# Patient Record
Sex: Male | Born: 1995 | Hispanic: No | Marital: Single | Smoking: Never smoker
Health system: Southern US, Community
[De-identification: ages and names within clinical notes are randomized; demographics above are authoritative.]

## PROBLEM LIST (undated history)

## (undated) DIAGNOSIS — J45909 Unspecified asthma, uncomplicated: Secondary | ICD-10-CM

## (undated) HISTORY — DX: Unspecified asthma, uncomplicated: J45.909

---
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
## 2015-07-14 ENCOUNTER — Ambulatory Visit: Payer: Self-pay | Admitting: Internal Medicine

## 2015-09-11 DIAGNOSIS — Z8709 Personal history of other diseases of the respiratory system: Secondary | ICD-10-CM | POA: Insufficient documentation

## 2015-09-11 DIAGNOSIS — L299 Pruritus, unspecified: Secondary | ICD-10-CM | POA: Insufficient documentation

## 2015-09-11 DIAGNOSIS — Z91013 Allergy to seafood: Secondary | ICD-10-CM | POA: Insufficient documentation

## 2015-09-11 DIAGNOSIS — Z91012 Allergy to eggs: Secondary | ICD-10-CM | POA: Insufficient documentation

## 2015-09-11 DIAGNOSIS — Z91018 Allergy to other foods: Secondary | ICD-10-CM | POA: Insufficient documentation

## 2015-09-11 DIAGNOSIS — T781XXA Other adverse food reactions, not elsewhere classified, initial encounter: Secondary | ICD-10-CM | POA: Insufficient documentation

## 2016-03-17 ENCOUNTER — Ambulatory Visit (INDEPENDENT_AMBULATORY_CARE_PROVIDER_SITE_OTHER): Payer: Managed Care, Other (non HMO) | Admitting: Physician Assistant

## 2016-03-17 ENCOUNTER — Encounter: Payer: Self-pay | Admitting: Physician Assistant

## 2016-03-17 VITALS — BP 127/92 | HR 111 | Temp 97.4°F | Wt 116.0 lb

## 2016-03-17 DIAGNOSIS — J029 Acute pharyngitis, unspecified: Secondary | ICD-10-CM

## 2016-03-17 MED ORDER — GUAIFENESIN-CODEINE 100-10 MG/5ML PO SYRP: 5.0000 mL | ORAL_SOLUTION | Freq: Three times a day (TID) | ORAL | 0 refills | Status: DC | PRN

## 2016-03-17 NOTE — Patient Instructions (Addendum)
Pharyngitis Pharyngitis is a sore throat (pharynx). There is redness, pain, and swelling of your throat. Follow these instructions at home:  Drink enough fluids to keep your pee (urine) clear or pale yellow.  Only take medicine as told by your doctor.  You may get sick again if you do not take medicine as told. Finish your medicines, even if you start to feel better.  Do not take aspirin.  Rest.  Rinse your mouth (gargle) with salt water ( tsp of salt per 1 qt of water) every 1-2 hours. This will help the pain.  If you are not at risk for choking, you can suck on hard candy or sore throat lozenges. Contact a doctor if:  You have large, tender lumps on your neck.  You have a rash.  You cough up green, yellow-brown, or bloody spit. Get help right away if:  You have a stiff neck.  You drool or cannot swallow liquids.  You throw up (vomit) or are not able to keep medicine or liquids down.  You have very bad pain that does not go away with medicine.  You have problems breathing (not from a stuffy nose). This information is not intended to replace advice given to you by your health care provider. Make sure you discuss any questions you have with your health care provider. Document Released: 07/28/2007 Document Revised: 07/17/2015 Document Reviewed: 10/16/2012 Elsevier Interactive Patient Education  2017 Elsevier Inc.   Consider Ibuprofen 800mg  alternate with tylenol 1000mg  every 4-6 hours.

## 2016-03-17 NOTE — Progress Notes (Signed)
   Subjective:    Patient ID: Alan Maxwell, male    DOB: Nov 21, 1995, 21 y.o.   MRN: [id number]  HPI  Pt is a 21 yo male who presents to the clinic with 2 days of ST, headache, fatigue. He has a childhood hx of asthma but has not given him any problems recently. He has albuterol inhaler at home but not using it. He does have some chest tightness but no wheezing. nyquil helped yesterday. No fever, chills, ear pain, or sinus pressure. No sick contacts. Cough is mild and dry. Rates ST 2/10.   Review of Systems  All other systems reviewed and are negative.      Objective:   Physical Exam  Constitutional: He is oriented to person, place, and time. He appears well-developed and well-nourished.  HENT:  Head: Normocephalic and atraumatic.  Right Ear: External ear normal.  Left Ear: External ear normal.  Nose: Nose normal.  Mouth/Throat: No oropharyngeal exudate.  TM's clear bilaterally.  Oropharynx erythematous without tonsilar swelling or exudate.  Negative for any maxillary or frontal sinus tenderness.   Eyes: Conjunctivae are normal. Right eye exhibits no discharge. Left eye exhibits no discharge.  Neck: Normal range of motion. Neck supple. No thyromegaly present.  Cardiovascular: Normal rate, regular rhythm and normal heart sounds.   Pulmonary/Chest: Effort normal and breath sounds normal.  Lymphadenopathy:    He has no cervical adenopathy.  Neurological: He is alert and oriented to person, place, and time.  Psychiatric: He has a normal mood and affect. His behavior is normal.          Assessment & Plan:  Marland Kitchen.Marland Kitchen.Alan Maxwell was seen today for sore throat.  Diagnoses and all orders for this visit:  Acute pharyngitis, unspecified etiology  Other orders -     guaiFENesin-codeine (ROBITUSSIN AC) 100-10 MG/5ML syrup; Take 5 mLs by mouth 3 (three) times daily as needed for cough.   Centor criteria -1 low probability for strep.  Discussed symptomatic care.  Encouraged ibuprofen 800mg   and/or tylenol 1000mg  every 4-6 hours.  Use albuterol 2 puffs as needed for SOB/Wheezing.  HO given.  Follow up as needed.

## 2016-10-27 ENCOUNTER — Ambulatory Visit (INDEPENDENT_AMBULATORY_CARE_PROVIDER_SITE_OTHER): Payer: Managed Care, Other (non HMO) | Admitting: Family Medicine

## 2016-10-27 ENCOUNTER — Encounter: Payer: Self-pay | Admitting: Family Medicine

## 2016-10-27 VITALS — BP 112/78 | HR 81 | Wt 116.0 lb

## 2016-10-27 DIAGNOSIS — G44309 Post-traumatic headache, unspecified, not intractable: Secondary | ICD-10-CM

## 2016-10-27 NOTE — Patient Instructions (Signed)
Thank you for coming in today.  Recheck as needed.  I think you should get better.
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
## 2017-10-11 ENCOUNTER — Encounter: Payer: Self-pay | Admitting: Family Medicine

## 2017-10-11 ENCOUNTER — Ambulatory Visit (INDEPENDENT_AMBULATORY_CARE_PROVIDER_SITE_OTHER): Payer: 59 | Admitting: Family Medicine

## 2017-10-11 VITALS — BP 97/52 | HR 72 | Ht 66.0 in | Wt 120.0 lb

## 2017-10-11 DIAGNOSIS — R5383 Other fatigue: Secondary | ICD-10-CM | POA: Diagnosis not present

## 2017-10-11 DIAGNOSIS — R6889 Other general symptoms and signs: Secondary | ICD-10-CM | POA: Diagnosis not present

## 2017-10-11 NOTE — Progress Notes (Signed)
Subjective:    Patient ID: Alan Maxwell, male    DOB: Sep 16, 1995, 22 y.o.   MRN: [id number]  HPI 22 year old male comes in today complaining of not feeling well since Sunday.  He has been working out in the heat and says he just did not feel well the next day when he got up.  He just felt extremely tired and extremely drained.  He says he just does not feel good when he is been working out in the heat.  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  He says he really does try to hydrate well.  He does have a history of asthma but he denies any recent chest pain or shortness of breath.  He does take Zyrtec seasonally but is not currently taking it.  Missed work on Sunday and is requesting a note for work.  And a note requesting that he not work out in the heat.   Review of Systems Fever, chills or sweats.  No pain anywhere.  BP (!) 97/52   Pulse 72   Ht 5\' 6"  (1.676 m)   Wt 120 lb (54.4 kg)   SpO2 98%   PF 380 L/min   BMI 19.37 kg/m     No Known Allergies  History reviewed. No pertinent past medical history.  History reviewed. No pertinent surgical history.  Social History   Socioeconomic History  . Marital status: Single    Spouse name: Not on file  . Number of children: Not on file  . Years of education: Not on file  . Highest education level: Not on file  Occupational History  . Not on file  Social Needs  . Financial resource strain: Not on file  . Food insecurity:    Worry: Not on file    Inability: Not on file  . Transportation needs:    Medical: Not on file    Non-medical: Not on file  Tobacco Use  . Smoking status: Never Smoker  . Smokeless tobacco: Never Used  Substance and Sexual Activity  . Alcohol use: Not on file  . Drug use: Not on file  . Sexual activity: Not on file  Lifestyle  . Physical activity:    Days per week: Not on file    Minutes per session: Not on file  . Stress: Not on file   Relationships  . Social connections:    Talks on phone: Not on file    Gets together: Not on file    Attends religious service: Not on file    Active member of club or organization: Not on file    Attends meetings of clubs or organizations: Not on file    Relationship status: Not on file  . Intimate partner violence:    Fear of current or ex partner: Not on file    Emotionally abused: Not on file    Physically abused: Not on file    Forced sexual activity: Not on file  Other Topics Concern  . Not on file  Social History Narrative  . Not on file    History reviewed. No pertinent family history.  Outpatient Encounter Medications as of 10/11/2017  Medication Sig  . Cetirizine HCl (ZYRTEC ALLERGY PO) Take by mouth.   No facility-administered encounter medications on file as of 10/11/2017.         Objective:   Physical Exam  Constitutional: He is oriented to person, place,  and time. He appears well-developed and well-nourished.  HENT:  Head: Normocephalic and atraumatic.  Right Ear: External ear normal.  Left Ear: External ear normal.  Nose: Nose normal.  Mouth/Throat: Oropharynx is clear and moist.  TMs and canals are clear.   Eyes: Pupils are equal, round, and reactive to light. Conjunctivae and EOM are normal.  Neck: Neck supple. No thyromegaly present.  Cardiovascular: Normal rate and normal heart sounds.  Pulmonary/Chest: Effort normal and breath sounds normal.  Lymphadenopathy:    He has no cervical adenopathy.  Neurological: He is alert and oriented to person, place, and time.  Skin: Skin is warm and dry.  Psychiatric: He has a normal mood and affect.        Assessment & Plan:  Fatigue-it happened after being out in the heat on a really hot humid day.  I explained that this is a little unusual for someone who is otherwise healthy and young so I would like to do some additional work-up including checking for anemia, checking thyroid, checking for normal kidney  and liver function.  Heat Sensitivity-we will provide note to work asking if they can allow him to work indoors on days that are greater than 85 degrees. Check TSH

## 2017-10-12 LAB — COMPLETE METABOLIC PANEL WITH GFR
AG RATIO: 1.9 (calc) (ref 1.0–2.5)
ALBUMIN MSPROF: 5.1 g/dL (ref 3.6–5.1)
ALT: 14 U/L (ref 9–46)
AST: 15 U/L (ref 10–40)
Alkaline phosphatase (APISO): 52 U/L (ref 40–115)
BUN: 22 mg/dL (ref 7–25)
CO2: 29 mmol/L (ref 20–32)
CREATININE: 1.27 mg/dL (ref 0.60–1.35)
Calcium: 10.5 mg/dL — ABNORMAL HIGH (ref 8.6–10.3)
Chloride: 102 mmol/L (ref 98–110)
GFR, EST AFRICAN AMERICAN: 92 mL/min/{1.73_m2} (ref >=60)
GFR, EST NON AFRICAN AMERICAN: 79 mL/min/{1.73_m2} (ref >=60)
GLOBULIN: 2.7 g/dL (ref 1.9–3.7)
Glucose, Bld: 83 mg/dL (ref 65–99)
POTASSIUM: 4.5 mmol/L (ref 3.5–5.3)
SODIUM: 138 mmol/L (ref 135–146)
TOTAL PROTEIN: 7.8 g/dL (ref 6.1–8.1)
Total Bilirubin: 2.4 mg/dL — ABNORMAL HIGH (ref 0.2–1.2)

## 2017-10-12 LAB — IRON,TIBC AND FERRITIN PANEL
%SAT: 47 % (ref 20–48)
Ferritin: 157 ng/mL (ref 38–380)
Iron: 133 ug/dL (ref 50–195)
TIBC: 281 mcg/dL (calc) (ref 250–425)

## 2017-10-12 LAB — CBC
HEMATOCRIT: 49.7 % (ref 38.5–50.0)
Hemoglobin: 16.6 g/dL (ref 13.2–17.1)
MCH: 31.3 pg (ref 27.0–33.0)
MCHC: 33.4 g/dL (ref 32.0–36.0)
MCV: 93.6 fL (ref 80.0–100.0)
MPV: 10.7 fL (ref 7.5–12.5)
PLATELETS: 271 10*3/uL (ref 140–400)
RBC: 5.31 10*6/uL (ref 4.20–5.80)
RDW: 11.1 % (ref 11.0–15.0)
WBC: 6.1 10*3/uL (ref 3.8–10.8)

## 2017-10-14 ENCOUNTER — Other Ambulatory Visit: Payer: Self-pay | Admitting: *Deleted

## 2017-10-14 DIAGNOSIS — R899 Unspecified abnormal finding in specimens from other organs, systems and tissues: Secondary | ICD-10-CM

## 2017-10-14 DIAGNOSIS — R5383 Other fatigue: Secondary | ICD-10-CM

## 2017-12-16 ENCOUNTER — Ambulatory Visit: Payer: 59 | Admitting: Physician Assistant

## 2017-12-16 ENCOUNTER — Encounter: Payer: Self-pay | Admitting: Physician Assistant

## 2017-12-16 VITALS — BP 114/76 | HR 72 | Temp 97.9°F | Wt 117.0 lb

## 2017-12-16 DIAGNOSIS — J029 Acute pharyngitis, unspecified: Secondary | ICD-10-CM | POA: Diagnosis not present

## 2017-12-16 DIAGNOSIS — J020 Streptococcal pharyngitis: Secondary | ICD-10-CM | POA: Diagnosis not present

## 2017-12-16 LAB — POCT RAPID STREP A (OFFICE): RAPID STREP A SCREEN: POSITIVE — AB

## 2017-12-16 MED ORDER — PENICILLIN G BENZATHINE 1200000 UNIT/2ML IM SUSP
1.2000 10*6.[IU] | Freq: Once | INTRAMUSCULAR | Status: AC
  Administered 2017-12-16: 1.2 10*6.[IU] via INTRAMUSCULAR

## 2017-12-16 MED ORDER — PREDNISONE 20 MG PO TABS: 40.0000 mg | ORAL_TABLET | Freq: Every day | ORAL | 0 refills | Status: AC

## 2017-12-16 NOTE — Progress Notes (Signed)
HPI:                                                                Alan Maxwell is a 22 y.o. male who presents to [facility] today for sore throat  Sore Throat   This is a new problem. The current episode started in the past 7 days. The problem has been gradually improving. The pain is worse on the left side. There has been no fever. The pain is moderate. Associated symptoms include swollen glands and trouble swallowing. Pertinent negatives include no coughing, drooling, ear pain or headaches. He has had no exposure to strep. He has tried nothing for the symptoms.      Past Medical History:  Diagnosis Date  . Asthma    History reviewed. No pertinent surgical history. Social History   Tobacco Use  . Smoking status: Never Smoker  . Smokeless tobacco: Never Used  Substance Use Topics  . Alcohol use: Not on file   family history is not on file.    ROS: negative except as noted in the HPI  Medications: Current Outpatient Medications  Medication Sig Dispense Refill  . Cetirizine HCl (ZYRTEC ALLERGY PO) Take by mouth.     No current facility-administered medications for this visit.    No Known Allergies     Objective:  BP 114/76   Pulse 72   Temp 97.9 F (36.6 C) (Oral)   Wt 117 lb (53.1 kg)   BMI 18.88 kg/m  Gen:  alert, not ill-appearing, no distress, appropriate for age HEENT: head normocephalic without obvious abnormality, conjunctiva and cornea clear, TM's clear, nasal mucosa pink, orpharynx with erythema, tonsils grade 2, no exudates, uvula midline, neck supple, there is left tonsillar adenopathy, trachea midline Pulm: Normal work of breathing, normal phonation, clear to auscultation bilaterally, no wheezes, rales or rhonchi CV: Normal rate, regular rhythm, s1 and s2 distinct, no murmurs, clicks or rubs  Neuro: alert and oriented x 3, no tremor MSK: extremities atraumatic, normal gait and station Skin:  intact, no rashes on exposed skin, no jaundice, no cyanosis   No results found for this or any previous visit (from the past 72 hour(s)). No results found.    Assessment and Plan: 22 y.o. male with   .Alan Maxwell was seen today for sore throat.  Diagnoses and all orders for this visit:  Acute streptococcal pharyngitis -     predniSONE (DELTASONE) 20 MG tablet; Take 2 tablets (40 mg total) by mouth daily with breakfast for 5 days. -     penicillin g benzathine (BICILLIN LA) 1200000 UNIT/2ML injection 1.2 Million Units  Sore throat -     POCT rapid strep A   Treated in office with Bicillin Prednisone burst x 5 days  Patient education and anticipatory guidance given Patient agrees with treatment plan Follow-up as needed if symptoms worsen or fail to improve  Levonne Hubert PA-C

## 2017-12-16 NOTE — Patient Instructions (Signed)
Strep Throat Strep throat is a bacterial infection of the throat. Your health care provider may call the infection tonsillitis or pharyngitis, depending on whether there is swelling in the tonsils or at the back of the throat. Strep throat is most common during the cold months of the year in children who are 5-23 years of age, but it can happen during any season in people of any age. This infection is spread from person to person (contagious) through coughing, sneezing, or close contact. What are the causes? Strep throat is caused by the bacteria called Streptococcus pyogenes. What increases the risk? This condition is more likely to develop in:  People who spend time in crowded places where the infection can spread easily.  People who have close contact with someone who has strep throat.  What are the signs or symptoms? Symptoms of this condition include:  Fever or chills.  Redness, swelling, or pain in the tonsils or throat.  Pain or difficulty when swallowing.  White or yellow spots on the tonsils or throat.  Swollen, tender glands in the neck or under the jaw.  Red rash all over the body (rare).  How is this diagnosed? This condition is diagnosed by performing a rapid strep test or by taking a swab of your throat (throat culture test). Results from a rapid strep test are usually ready in a few minutes, but throat culture test results are available after one or two days. How is this treated? This condition is treated with antibiotic medicine. Follow these instructions at home: Medicines  Take over-the-counter and prescription medicines only as told by your health care provider.  Take your antibiotic as told by your health care provider. Do not stop taking the antibiotic even if you start to feel better.  Have family members who also have a sore throat or fever tested for strep throat. They may need antibiotics if they have the strep infection. Eating and drinking  Do not  share food, drinking cups, or personal items that could cause the infection to spread to other people.  If swallowing is difficult, try eating soft foods until your sore throat feels better.  Drink enough fluid to keep your urine clear or pale yellow. General instructions  Gargle with a salt-water mixture 3-4 times per day or as needed. To make a salt-water mixture, completely dissolve -1 tsp of salt in 1 cup of warm water.  Make sure that all household members wash their hands well.  Get plenty of rest.  Stay home from school or work until you have been taking antibiotics for 24 hours.  Keep all follow-up visits as told by your health care provider. This is important. Contact a health care provider if:  The glands in your neck continue to get bigger.  You develop a rash, cough, or earache.  You cough up a thick liquid that is green, yellow-brown, or bloody.  You have pain or discomfort that does not get better with medicine.  Your problems seem to be getting worse rather than better.  You have a fever. Get help right away if:  You have new symptoms, such as vomiting, severe headache, stiff or painful neck, chest pain, or shortness of breath.  You have severe throat pain, drooling, or changes in your voice.  You have swelling of the neck, or the skin on the neck becomes red and tender.  You have signs of dehydration, such as fatigue, dry mouth, and decreased urination.  You become increasingly sleepy, or   you cannot wake up completely.  Your joints become red or painful. This information is not intended to replace advice given to you by your health care provider. Make sure you discuss any questions you have with your health care provider. Document Released: 02/06/2000 Document Revised: 10/08/2015 Document Reviewed: 06/03/2014 Elsevier Interactive Patient Education  2018 [employer]vier Inc.  

## 2020-04-15 ENCOUNTER — Ambulatory Visit: Payer: 59 | Admitting: Family Medicine

## 2020-04-21 ENCOUNTER — Other Ambulatory Visit: Payer: Self-pay

## 2020-04-21 ENCOUNTER — Encounter: Payer: Self-pay | Admitting: Family Medicine

## 2020-04-21 ENCOUNTER — Ambulatory Visit (INDEPENDENT_AMBULATORY_CARE_PROVIDER_SITE_OTHER): Payer: 59 | Admitting: Family Medicine

## 2020-04-21 VITALS — BP 134/48 | HR 62 | Ht 66.0 in | Wt 120.0 lb

## 2020-04-21 DIAGNOSIS — R109 Unspecified abdominal pain: Secondary | ICD-10-CM

## 2020-04-21 DIAGNOSIS — L309 Dermatitis, unspecified: Secondary | ICD-10-CM | POA: Insufficient documentation

## 2020-04-21 DIAGNOSIS — L2082 Flexural eczema: Secondary | ICD-10-CM

## 2020-04-21 NOTE — Progress Notes (Signed)
Acute Office Visit  Subjective:    Patient ID: Alan Maxwell, male    DOB: 1995-05-09, 25 y.o.   MRN: [id number]  Chief Complaint  Patient presents with  . Abdominal Pain    HPI Patient is in today for left left lateral chest pain over the rib area below the axilla.  He states been going on for about a few weeks.  Rates his pain a 1 out of 10 today.  At its worst he would rated a 3 out of 10.  He says it can last an hour or 2 but that is usually at the longest.  Nothing really alleviates the pain once it starts he just has to wait till it goes away it can sometimes be worse if he lays on his left side.  He denies any fever, sweats, chills.  Denies any nausea vomiting or diarrhea.  No blood in the stool.  No dysuria.  Scribes the pain is dull and sometimes throbbing.  Past Medical History:  Diagnosis Date  . Asthma     History reviewed. No pertinent surgical history.  History reviewed. No pertinent family history.  Social History   Socioeconomic History  . Marital status: Single    Spouse name: Not on file  . Number of children: Not on file  . Years of education: Not on file  . Highest education level: Not on file  Occupational History  . Not on file  Tobacco Use  . Smoking status: Never Smoker  . Smokeless tobacco: Never Used  Substance and Sexual Activity  . Alcohol use: Not on file  . Drug use: Not on file  . Sexual activity: Not on file  Other Topics Concern  . Not on file  Social History Narrative  . Not on file   Social Determinants of Health   Financial Resource Strain: Not on file  Food Insecurity: Not on file  Transportation Needs: Not on file  Physical Activity: Not on file  Stress: Not on file  Social Connections: Not on file  Intimate Partner Violence: Not on file    Outpatient Medications Prior to Visit  Medication Sig Dispense Refill  . Cetirizine HCl (ZYRTEC ALLERGY PO) Take by mouth.     No facility-administered medications prior to  visit.    No Known Allergies  Review of Systems     Objective:    Physical Exam Constitutional:      Appearance: He is well-developed and well-nourished.  HENT:     Head: Normocephalic and atraumatic.  Cardiovascular:     Rate and Rhythm: Normal rate and regular rhythm.     Heart sounds: Normal heart sounds.  Pulmonary:     Effort: Pulmonary effort is normal.     Breath sounds: Normal breath sounds.  Abdominal:     General: Abdomen is flat. Bowel sounds are normal.     Palpations: Abdomen is soft.     Tenderness: There is generalized abdominal tenderness. There is no guarding or rebound.     Comments: Non tender over rib cage.   Skin:    General: Skin is warm and dry.  Neurological:     Mental Status: He is alert and oriented to person, place, and time.  Psychiatric:        Mood and Affect: Mood and affect normal.        Behavior: Behavior normal.     BP (!) 134/48   Pulse 62   Ht 5\' 6"  (1.676 m)  Wt 120 lb (54.4 kg)   SpO2 100%   BMI 19.37 kg/m  Wt Readings from Last 3 Encounters:  04/21/20 120 lb (54.4 kg)  12/16/17 117 lb (53.1 kg)  10/11/17 120 lb (54.4 kg)    Health Maintenance Due  Topic Date Due  . Hepatitis C Screening  Never done  . HIV Screening  Never done  . TETANUS/TDAP  Never done  . COVID-19 Vaccine (2 - Pfizer 3-dose series) 08/01/2019  . INFLUENZA VACCINE  Never done    There are no preventive care reminders to display for this patient.   No results found for: TSH Lab Results  Component Value Date   WBC 6.1 10/11/2017   HGB 16.6 10/11/2017   HCT 49.7 10/11/2017   MCV 93.6 10/11/2017   PLT 271 10/11/2017   Lab Results  Component Value Date   NA 138 10/11/2017   K 4.5 10/11/2017   CO2 29 10/11/2017   GLUCOSE 83 10/11/2017   BUN 22 10/11/2017   CREATININE 1.27 10/11/2017   BILITOT 2.4 (H) 10/11/2017   ALKPHOS 93 06/30/2011   AST 15 10/11/2017   ALT 14 10/11/2017   PROT 7.8 10/11/2017   ALBUMIN 5.0 06/30/2011   CALCIUM  10.5 (H) 10/11/2017   No results found for: CHOL No results found for: HDL No results found for: LDLCALC No results found for: TRIG No results found for: CHOLHDL No results found for: IFOY7X     Assessment & Plan:   Problem List Items Addressed This Visit      Musculoskeletal and Integument   Eczema    Other Visit Diagnoses    Left sided abdominal pain    -  Primary   Relevant Orders   CBC with Differential/Platelet   COMPLETE METABOLIC PANEL WITH GFR   Lipase   Urinalysis, Routine w reflex microscopic     Left-sided pain that is waxing and waning.  Will work-up further with urinalysis, lipase, CBC with differential.  If negative consider chest x-ray and abdominal ultrasound for further work-up.   No orders of the defined types were placed in this encounter.    Nani Gasser, MD

## 2020-04-22 ENCOUNTER — Telehealth: Payer: Self-pay | Admitting: Family Medicine

## 2020-04-22 LAB — URINALYSIS, ROUTINE W REFLEX MICROSCOPIC
Bilirubin Urine: NEGATIVE
Glucose, UA: NEGATIVE
Hgb urine dipstick: NEGATIVE
Ketones, ur: NEGATIVE
Leukocytes,Ua: NEGATIVE
Nitrite: NEGATIVE
Protein, ur: NEGATIVE
Specific Gravity, Urine: 1.024 (ref 1.001–1.03)
pH: 7 (ref 5.0–8.0)

## 2020-04-22 LAB — COMPLETE METABOLIC PANEL WITH GFR
AG Ratio: 1.6 (calc) (ref 1.0–2.5)
ALT: 15 U/L (ref 9–46)
AST: 18 U/L (ref 10–40)
Albumin: 4.6 g/dL (ref 3.6–5.1)
Alkaline phosphatase (APISO): 59 U/L (ref 36–130)
BUN: 17 mg/dL (ref 7–25)
CO2: 30 mmol/L (ref 20–32)
Calcium: 10 mg/dL (ref 8.6–10.3)
Chloride: 105 mmol/L (ref 98–110)
Creat: 1.13 mg/dL (ref 0.60–1.35)
GFR, Est African American: 104 mL/min/{1.73_m2} (ref >=60)
GFR, Est Non African American: 90 mL/min/{1.73_m2} (ref >=60)
Globulin: 2.9 g/dL (calc) (ref 1.9–3.7)
Glucose, Bld: 81 mg/dL (ref 65–99)
Potassium: 4.8 mmol/L (ref 3.5–5.3)
Sodium: 142 mmol/L (ref 135–146)
Total Bilirubin: 1.3 mg/dL — ABNORMAL HIGH (ref 0.2–1.2)
Total Protein: 7.5 g/dL (ref 6.1–8.1)

## 2020-04-22 LAB — CBC WITH DIFFERENTIAL/PLATELET
Absolute Monocytes: 268 cells/uL (ref 200–950)
Basophils Absolute: 47 cells/uL (ref 0–200)
Basophils Relative: 0.7 %
Eosinophils Absolute: 322 cells/uL (ref 15–500)
Eosinophils Relative: 4.8 %
HCT: 48.1 % (ref 38.5–50.0)
Hemoglobin: 15.7 g/dL (ref 13.2–17.1)
Lymphs Abs: 1293 cells/uL (ref 850–3900)
MCH: 31.3 pg (ref 27.0–33.0)
MCHC: 32.6 g/dL (ref 32.0–36.0)
MCV: 96 fL (ref 80.0–100.0)
MPV: 10.7 fL (ref 7.5–12.5)
Monocytes Relative: 4 %
Neutro Abs: 4770 cells/uL (ref 1500–7800)
Neutrophils Relative %: 71.2 %
Platelets: 256 10*3/uL (ref 140–400)
RBC: 5.01 10*6/uL (ref 4.20–5.80)
RDW: 11.6 % (ref 11.0–15.0)
Total Lymphocyte: 19.3 %
WBC: 6.7 10*3/uL (ref 3.8–10.8)

## 2020-04-22 LAB — LIPASE: Lipase: 15 U/L (ref 7–60)

## 2020-04-22 NOTE — Telephone Encounter (Signed)
Dr Eden Emms called and stated you had talked to him about doing a chest x ray and at this time he does not want to have the xray. - CF

## 2020-12-17 ENCOUNTER — Telehealth: Payer: Self-pay | Admitting: General Practice

## 2020-12-17 NOTE — Telephone Encounter (Signed)
Transition Care Management Follow-up Telephone Call Date of discharge and from where: 12/16/20 from [facility] How have you been since you were released from the hospital? Doing better.  Any questions or concerns? No  Items Reviewed: Did the pt receive and understand the discharge instructions provided? Yes  Medications obtained and verified? Yes  Other? No  Any new allergies since your discharge? No  Dietary orders reviewed? No Do you have support at home? Yes   Home Care and Equipment/Supplies: Were home health services ordered? no  Functional Questionnaire: (I = Independent and D = Dependent) ADLs: I  Bathing/Dressing- I  Meal Prep- I  Eating- I  Maintaining continence- I  Transferring/Ambulation- I  Managing Meds- I  Follow up appointments reviewed:  PCP Hospital f/u appt confirmed?  He has a follow up scheduled with [facility] on Friday. He lives farther from this office.   Specialist Hospital f/u appt confirmed? No   Are transportation arrangements needed? No  If their condition worsens, is the pt aware to call PCP or go to the Emergency Dept.? Yes Was the patient provided with contact information for the PCP's office or ED? Yes Was to pt encouraged to call back with questions or concerns? Yes

## 2022-03-25 ENCOUNTER — Encounter: Payer: Self-pay | Admitting: Family Medicine

## 2022-03-25 ENCOUNTER — Ambulatory Visit (INDEPENDENT_AMBULATORY_CARE_PROVIDER_SITE_OTHER): Payer: BC Managed Care – PPO | Admitting: Family Medicine

## 2022-03-25 VITALS — BP 106/62 | HR 76 | Ht 67.0 in | Wt 118.0 lb

## 2022-03-25 DIAGNOSIS — G245 Blepharospasm: Secondary | ICD-10-CM

## 2022-03-25 DIAGNOSIS — Z0000 Encounter for general adult medical examination without abnormal findings: Secondary | ICD-10-CM

## 2022-03-25 NOTE — Progress Notes (Signed)
Complete physical exam  Patient: Alan Maxwell   DOB: 01/08/1996   27 y.o. Male  MRN: [id number]  Subjective:    Chief Complaint  Patient presents with   Annual Exam    Alan Maxwell is a 27 y.o. male who presents today for a complete physical exam. He reports consuming a general diet.  Does some calistenics  He generally feels well.  He does not have additional problems to discuss today.  He has been getting a twitching of his eyelid on the right side.  It is not painful.  He is due to get new updated glasses soon.  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   Most recent fall risk assessment:     No data to display           Most recent depression screenings:    12/16/2017    2:55 PM 10/27/2016   11:07 AM  PHQ 2/9 Scores  PHQ - 2 Score 0 0        Patient Care Team: Hali Marry, MD as PCP - General (Family Medicine)   Outpatient Medications Prior to Visit  Medication Sig   Cetirizine HCl (ZYRTEC ALLERGY PO) Take by mouth.   valACYclovir (VALTREX) 500 MG tablet Take 500 mg by mouth 2 (two) times daily.   No facility-administered medications prior to visit.    ROS        Objective:     BP 106/62   Pulse 76   Ht 5\' 7"  (1.702 m)   Wt 118 lb (53.5 kg)   SpO2 97%   BMI 18.48 kg/m    Physical Exam Constitutional:      Appearance: He is well-developed.  HENT:     Head: Normocephalic and atraumatic.     Right Ear: Tympanic membrane, ear canal and external ear normal.     Left Ear: Tympanic membrane, ear canal and external ear normal.     Nose: Nose normal.     Mouth/Throat:     Pharynx: Oropharynx is clear.  Eyes:     Conjunctiva/sclera: Conjunctivae normal.     Pupils: Pupils are equal, round, and reactive to light.  Neck:     Thyroid: No thyromegaly.  Cardiovascular:     Rate and Rhythm: Normal rate and regular rhythm.     Heart sounds: Normal heart sounds.  Pulmonary:     Effort:  Pulmonary effort is normal.     Breath sounds: Normal breath sounds.  Abdominal:     General: Bowel sounds are normal. There is no distension.     Palpations: Abdomen is soft. There is no mass.     Tenderness: There is no abdominal tenderness. There is no guarding or rebound.  Musculoskeletal:        General: Normal range of motion.     Cervical back: Normal range of motion and neck supple. No tenderness.  Lymphadenopathy:     Cervical: No cervical adenopathy.  Skin:    General: Skin is warm and dry.  Neurological:     Mental Status: He is alert and oriented to person, place, and time.     Deep Tendon Reflexes: Reflexes are normal and symmetric.  Psychiatric:        Behavior: Behavior normal.        Thought Content: Thought content normal.        Judgment: Judgment normal.      No results found for any  visits on 03/25/22.     Assessment & Plan:    Routine Health Maintenance and Physical Exam  Immunization History  Administered Date(s) Administered   PFIZER(Purple Top)SARS-COV-2 Vaccination 07/11/2019   Tdap 12/16/2020    Health Maintenance  Topic Date Due   HIV Screening  Never done   Hepatitis C Screening  Never done   COVID-19 Vaccine (2 - 2023-24 season) 04/10/2022 (Originally 10/23/2021)   INFLUENZA VACCINE  05/23/2022 (Originally 09/22/2021)   DTaP/Tdap/Td (2 - Td or Tdap) 12/17/2030   Pneumococcal Vaccine 80-69 Years old  Aged Out   HPV VACCINES  Aged Out    Discussed health benefits of physical activity, and encouraged him to engage in regular exercise appropriate for his age and condition.  Problem List Items Addressed This Visit   None Visit Diagnoses     Wellness examination    -  Primary   Relevant Orders   Lipid Panel w/reflex Direct LDL   COMPLETE METABOLIC PANEL WITH GFR   Blepharospasm of right eye           Keep up a regular exercise program and make sure you are eating a healthy diet Try to eat 4 servings of dairy a day, or if you are  lactose intolerant take a calcium with vitamin D daily.  Your vaccines are up to date.   No follow-ups on file.     Beatrice Lecher, MD

## 2022-03-26 LAB — COMPLETE METABOLIC PANEL WITH GFR
AG Ratio: 1.8 (calc) (ref 1.0–2.5)
ALT: 10 U/L (ref 9–46)
AST: 13 U/L (ref 10–40)
Albumin: 4.9 g/dL (ref 3.6–5.1)
Alkaline phosphatase (APISO): 46 U/L (ref 36–130)
BUN/Creatinine Ratio: 25 (calc) — ABNORMAL HIGH (ref 6–22)
BUN: 26 mg/dL — ABNORMAL HIGH (ref 7–25)
CO2: 31 mmol/L (ref 20–32)
Calcium: 10 mg/dL (ref 8.6–10.3)
Chloride: 101 mmol/L (ref 98–110)
Creat: 1.03 mg/dL (ref 0.60–1.24)
Globulin: 2.8 g/dL (calc) (ref 1.9–3.7)
Glucose, Bld: 94 mg/dL (ref 65–99)
Potassium: 4 mmol/L (ref 3.5–5.3)
Sodium: 139 mmol/L (ref 135–146)
Total Bilirubin: 1.5 mg/dL — ABNORMAL HIGH (ref 0.2–1.2)
Total Protein: 7.7 g/dL (ref 6.1–8.1)
eGFR: 102 mL/min/{1.73_m2} (ref >=60)

## 2022-03-26 LAB — LIPID PANEL W/REFLEX DIRECT LDL
Cholesterol: 183 mg/dL (ref <200)
HDL: 62 mg/dL (ref >=40)
LDL Cholesterol (Calc): 102 mg/dL (calc) — ABNORMAL HIGH
Non-HDL Cholesterol (Calc): 121 mg/dL (calc) (ref <130)
Total CHOL/HDL Ratio: 3 (calc) (ref <5.0)
Triglycerides: 98 mg/dL (ref <150)

## 2022-03-26 NOTE — Progress Notes (Signed)
Call pt: LDL just borderline.  Continue to work on healthy foods choices.  All other lab are OK

## 2022-05-06 ENCOUNTER — Ambulatory Visit
Admission: EM | Admit: 2022-05-06 | Discharge: 2022-05-06 | Disposition: A | Discharge status: Home / Self Care | Payer: BC Managed Care – PPO | Attending: Emergency Medicine | Admitting: Emergency Medicine

## 2022-05-06 DIAGNOSIS — J069 Acute upper respiratory infection, unspecified: Secondary | ICD-10-CM | POA: Diagnosis not present

## 2022-05-06 DIAGNOSIS — Z8616 Personal history of COVID-19: Secondary | ICD-10-CM | POA: Insufficient documentation

## 2022-05-06 DIAGNOSIS — Z1152 Encounter for screening for COVID-19: Secondary | ICD-10-CM | POA: Diagnosis not present

## 2022-05-06 DIAGNOSIS — J45909 Unspecified asthma, uncomplicated: Secondary | ICD-10-CM | POA: Insufficient documentation

## 2022-05-06 DIAGNOSIS — R0982 Postnasal drip: Secondary | ICD-10-CM | POA: Insufficient documentation

## 2022-05-06 DIAGNOSIS — R0981 Nasal congestion: Secondary | ICD-10-CM | POA: Diagnosis not present

## 2022-05-06 LAB — SARS CORONAVIRUS 2 BY RT PCR: SARS Coronavirus 2 by RT PCR: NEGATIVE

## 2022-05-06 MED ORDER — IPRATROPIUM BROMIDE 0.06 % NA SOLN: 2.0000 | Freq: Four times a day (QID) | NASAL | 12 refills | Status: AC

## 2022-05-06 NOTE — ED Triage Notes (Signed)
Pt states he thinks he has COVID started experiencing runny nose today. Not sure of contact and denies fever

## 2022-05-06 NOTE — ED Provider Notes (Signed)
[facility]    CSN: [id number] Arrival date & time: 05/06/22  1834      History   Chief Complaint Chief Complaint  Patient presents with   Nasal Congestion    HPI Alan Maxwell is a 27 y.o. male.   HPI  27 year old male here for evaluation of runny nose.  The patient has a past medical history that significant for asthma presents for evaluation open runny nose that started today.  This is associated with some postnasal drip and a tickle in his throat but he denies any sore throat.  He also denies fever, ear pain, cough, GI complaints, body aches, or headache.  He is requesting a COVID test.  He states he believes one of his coworkers is COVID-positive and he has had COVID in the past with his only symptom being a runny nose.  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 No pertinent surgical history.     Home Medications    Prior to Admission medications   Medication Sig Start Date End Date Taking? Authorizing Provider  ipratropium (ATROVENT) 0.06 % nasal spray Place 2 sprays into both nostrils 4 (four) times daily. 05/06/22  Yes Margarette Canada, NP  Cetirizine HCl (ZYRTEC ALLERGY PO) Take by mouth.    [provider]  valACYclovir (VALTREX) 500 MG tablet Take 500 mg by mouth 2 (two) times daily. 02/25/22   [provider]    Family History History reviewed. No pertinent family history.  Social History Social History   Tobacco Use   Smoking status: Never   Smokeless tobacco: Never  Substance Use Topics   Alcohol use: Never   Drug use: Yes    Types: Marijuana     Allergies   Patient has no known allergies.   Review of  Systems Review of Systems  Constitutional:  Negative for fever.  HENT:  Positive for postnasal drip, rhinorrhea and sore throat. Negative for congestion and ear pain.   Respiratory:  Negative for cough.      Physical Exam Triage Vital Signs ED Triage Vitals [05/06/22 1850]  Enc Vitals Group     BP 123/79     Pulse Rate 60     Resp 20     Temp 98.5 F (36.9 C)     Temp src      SpO2 99 %     Weight      Height      Head Circumference      Peak Flow      Pain Score 0     Pain Loc      Pain Edu?      Excl. in [place]?    No data found.  Updated Vital Signs BP 123/79   Pulse 60   Temp 98.5 F (36.9 C)   Resp 20   SpO2 99%   Visual Acuity Right Eye Distance:   Left Eye Distance:   Bilateral Distance:    Right Eye Near:   Left Eye Near:    Bilateral Near:     Physical Exam Vitals and nursing note reviewed.  Constitutional:  Appearance: Normal appearance. He is not ill-appearing.  HENT:     Head: Normocephalic and atraumatic.     Right Ear: Tympanic membrane, ear canal and external ear normal. There is no impacted cerumen.     Left Ear: Tympanic membrane, ear canal and external ear normal. There is no impacted cerumen.     Nose: Congestion and rhinorrhea present.     Comments: Nasal mucosa is mildly erythematous and edematous with clear discharge in both nares.    Mouth/Throat:     Mouth: Mucous membranes are moist.     Pharynx: Oropharynx is clear. Posterior oropharyngeal erythema present. No oropharyngeal exudate.     Comments: Mild erythema to the posterior oropharynx with clear postnasal drip. Cardiovascular:     Rate and Rhythm: Normal rate and regular rhythm.     Pulses: Normal pulses.     Heart sounds: Normal heart sounds. No murmur heard.    No friction rub. No gallop.  Pulmonary:     Effort: Pulmonary effort is normal.     Breath sounds: Normal breath sounds. No wheezing, rhonchi or rales.  Musculoskeletal:     Cervical back: Normal range of  motion and neck supple.  Lymphadenopathy:     Cervical: No cervical adenopathy.  Skin:    General: Skin is warm and dry.     Capillary Refill: Capillary refill takes less than 2 seconds.     Findings: No erythema or rash.  Neurological:     General: No focal deficit present.     Mental Status: He is alert and oriented to person, place, and time.      UC Treatments / Results  Labs (all labs ordered are listed, but only abnormal results are displayed) Labs Reviewed  SARS CORONAVIRUS 2 BY RT PCR    EKG   Radiology No results found.  Procedures Procedures (including critical care time)  Medications Ordered in UC Medications - No data to display  Initial Impression / Assessment and Plan / UC Course  I have reviewed the triage vital signs and the nursing notes.  Pertinent labs & imaging results that were available during my care of the patient were reviewed by me and considered in my medical decision making (see chart for details).   Patient is a nontoxic-appearing 27 year old male presenting for evaluation of runny nose and requesting a COVID test.  He reports that he has had COVID in the past his only symptom is a runny nose and he believes that one of his coworkers is currently positive for COVID.  On exam he does have inflamed nasal mucosa with clear rhinorrhea and movement of erythema to the posterior oropharynx with clear postnasal drip.  Remainder of his cardiopulmonary exam and respiratory exam are benign.  I will order a COVID PCR.  I did advise the patient that the guidelines have changed and that he will need to quarantine if he has had a fever otherwise he just needs to wear a mask around others for the first 5 days of symptoms if he turns out to be COVID-positive.  COVID PCR is negative.  I will discharge patient home with a diagnosis of viral URI.  I will prescribe Atrovent nasal spray to help with nasal congestion and postnasal drip.  Final Clinical Impressions(s)  / UC Diagnoses   Final diagnoses:  Viral upper respiratory tract infection     Discharge Instructions      Your COVID test today was negative but I do believe you have  a viral respiratory infection.  Please use the Atrovent nasal spray that I prescribed to help with the nasal congestion postnasal drip.  You can instill 2 squirts in each nostril every 6 hours as needed.  If you develop a cough he can use over-the-counter cough preparations such as Delsym, Robitussin, or Zarbee's.  If any new or worsening symptoms develop please return for reevaluation or see your primary care provider.     ED Prescriptions     Medication Sig Dispense Auth. Provider   ipratropium (ATROVENT) 0.06 % nasal spray Place 2 sprays into both nostrils 4 (four) times daily. 15 mL Margarette Canada, NP      PDMP not reviewed this encounter.   Margarette Canada, NP 05/06/22 1935

## 2022-05-06 NOTE — Discharge Instructions (Addendum)
Your COVID test today was negative but I do believe you have a viral respiratory infection.  Please use the Atrovent nasal spray that I prescribed to help with the nasal congestion postnasal drip.  You can instill 2 squirts in each nostril every 6 hours as needed.  If you develop a cough he can use over-the-counter cough preparations such as Delsym, Robitussin, or Zarbee's.  If any new or worsening symptoms develop please return for reevaluation or see your primary care provider.

## 2022-11-09 ENCOUNTER — Ambulatory Visit (INDEPENDENT_AMBULATORY_CARE_PROVIDER_SITE_OTHER): Payer: Self-pay | Admitting: Family Medicine

## 2022-11-09 ENCOUNTER — Encounter: Payer: Self-pay | Admitting: Family Medicine

## 2022-11-09 VITALS — BP 123/78 | HR 92 | Ht 67.0 in | Wt 121.0 lb

## 2022-11-09 DIAGNOSIS — B002 Herpesviral gingivostomatitis and pharyngotonsillitis: Secondary | ICD-10-CM | POA: Insufficient documentation

## 2022-11-09 MED ORDER — DOXYCYCLINE HYCLATE 100 MG PO TABS: 100.0000 mg | ORAL_TABLET | Freq: Two times a day (BID) | ORAL | 0 refills | Status: DC

## 2022-11-09 MED ORDER — VALACYCLOVIR HCL 500 MG PO TABS: 500.0000 mg | ORAL_TABLET | Freq: Every day | ORAL | 1 refills | Status: AC

## 2022-11-09 NOTE — Progress Notes (Signed)
Acute Office Visit  Subjective:     Patient ID: Alan Maxwell, male    DOB: 1995/03/31, 27 y.o.   MRN: [id number]  Chief Complaint  Patient presents with   oral herpes    HPI Patient is in today for oral herpes.  He is been taking his valacyclovir and also using lysine supplement but it does not seem to be controlling it well he wonders if it could have a secondary infection.  He says normally he gets about 1-2 outbreaks per month but recently it has been at least weekly and take several days to go away he is also noted some ulcers on his throat which is not typical but he has had that happen before.  He said he had what felt like a sinus infection a few weeks ago and feels better in general but he still having some intermittent sinus congestion postnasal drip and sore throat.  He is also noticed a few ulcers up in his nose that he did not have previously.  ROS      Objective:    BP 123/78   Pulse 92   Ht 5\' 7"  (1.702 m)   Wt 121 lb (54.9 kg)   SpO2 100%   BMI 18.95 kg/m    Physical Exam Vitals reviewed.  Constitutional:      Appearance: Normal appearance.  HENT:     Head: Normocephalic.     Comments: No active nasal lesions.     Mouth/Throat:     Mouth: Mucous membranes are moist.     Comments: Have a white oval-shaped ulcer on the left posterior pharynx.  None on the gums or teeth today.  None around the lips. Eyes:     Conjunctiva/sclera: Conjunctivae normal.  Pulmonary:     Effort: Pulmonary effort is normal.  Neurological:     Mental Status: He is alert and oriented to person, place, and time.  Psychiatric:        Mood and Affect: Mood normal.        Behavior: Behavior normal.     No results found for any visits on 11/09/22.      Assessment & Plan:   Problem List Items Addressed This Visit       Digestive   Oral herpes simplex infection - Primary   Relevant Medications   valACYclovir (VALTREX) 500 MG tablet    Acute sinusitis -we will go  ahead and treat with doxycycline and then he can start suppressive therapy on valacyclovir.  HSV - will start suppressive therapy for 3-4 doing well then can discontinue go back to as needed use.  If the ulcers do not seem to improve on suppressive therapy then encouraged him to let me know we discussed that there can be some other causes.   Meds ordered this encounter  Medications   valACYclovir (VALTREX) 500 MG tablet    Sig: Take 1 tablet (500 mg total) by mouth daily.    Dispense:  90 tablet    Refill:  1   doxycycline (VIBRA-TABS) 100 MG tablet    Sig: Take 1 tablet (100 mg total) by mouth 2 (two) times daily.    Dispense:  14 tablet    Refill:  0    No follow-ups on file.  Nani Gasser, MD

## 2023-06-28 ENCOUNTER — Ambulatory Visit: Payer: Self-pay | Admitting: *Deleted

## 2023-06-28 ENCOUNTER — Ambulatory Visit: Payer: Self-pay | Admitting: Physician Assistant

## 2023-06-28 ENCOUNTER — Encounter: Payer: Self-pay | Admitting: Physician Assistant

## 2023-06-28 VITALS — BP 136/66 | HR 94 | Temp 98.7°F | Ht 67.0 in | Wt 118.0 lb

## 2023-06-28 DIAGNOSIS — L723 Sebaceous cyst: Secondary | ICD-10-CM

## 2023-06-28 DIAGNOSIS — Z202 Contact with and (suspected) exposure to infections with a predominantly sexual mode of transmission: Secondary | ICD-10-CM

## 2023-06-28 DIAGNOSIS — R1032 Left lower quadrant pain: Secondary | ICD-10-CM

## 2023-06-28 MED ORDER — DOXYCYCLINE HYCLATE 100 MG PO TABS: 100.0000 mg | ORAL_TABLET | Freq: Two times a day (BID) | ORAL | 0 refills | Status: DC

## 2023-06-28 MED ORDER — DOXYCYCLINE HYCLATE 100 MG PO TABS: 100.0000 mg | ORAL_TABLET | Freq: Two times a day (BID) | ORAL | 0 refills | Status: AC

## 2023-06-28 NOTE — Patient Instructions (Signed)
 Warm compresses over cyst in left groin Ibuprofen as needed for pain control up to 600mg  up to three times a day Will check UA in office and STD screening Start doxycycline  for 7 days
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
## 2023-06-28 NOTE — Progress Notes (Signed)
 Acute Office Visit  Subjective:     Patient ID: Alan Maxwell, male    DOB: February 23, 1995, 28 y.o.   MRN: [id number]  Chief Complaint  Patient presents with   Groin Pain    HPI Patient is in today for left groin pain and palpation of small lump. Pt did have some trauma to the left groin a few days earlier and then noticed a painful lump. He did have abx at home and started it and already seems to be feeling better. No fever, chills, body aches. No painful urination or penile discharge. No flank pain.   .. 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   Allergic to shellfish 09/11/2015   Adverse food reaction 09/11/2015   Eczema 04/21/2020   Oral herpes simplex infection 11/09/2022   Resolved Ambulatory Problems    Diagnosis Date Noted   Anorectal abscess 01/24/2009   Finger injury 05/29/2009   Itching 09/11/2015   Past Medical History:  Diagnosis Date   Asthma        ROS See HPI      Objective:    BP 136/66   Pulse 94   Temp 98.7 F (37.1 C) (Oral)   Ht 5\' 7"  (1.702 m)   Wt 118 lb (53.5 kg)   SpO2 99%   BMI 18.48 kg/m  BP Readings from Last 3 Encounters:  06/28/23 136/66  11/09/22 123/78  05/06/22 123/79   Wt Readings from Last 3 Encounters:  06/28/23 118 lb (53.5 kg)  11/09/22 121 lb (54.9 kg)  03/25/22 118 lb (53.5 kg)    .[name] Results for orders placed or performed in visit on 06/28/23  GC/Chlamydia Probe Amp([facility])   Collection Time: 06/28/23  2:05 PM  Result Value Ref Range   Chlamydia trachomatis, NAA Negative Negative   Neisseria Gonorrhoeae by PCR Negative Negative  Urine Culture   Collection Time: 06/28/23  3:29 PM   Specimen: Urine   Urine  Result Value Ref Range   Urine Culture, Routine Final report    Organism ID, Bacteria No growth      Physical Exam Constitutional:      Appearance: Normal  appearance.  HENT:     Head: Normocephalic.  Cardiovascular:     Rate and Rhythm: Normal rate and regular rhythm.     Pulses: Normal pulses.  Pulmonary:     Effort: Pulmonary effort is normal.     Breath sounds: Normal breath sounds.  Abdominal:     General: Bowel sounds are normal. There is no distension.     Palpations: Abdomen is soft. There is no mass.     Tenderness: There is no abdominal tenderness. There is no right CVA tenderness, left CVA tenderness, guarding or rebound.     Hernia: No hernia is present.  Genitourinary:    Comments: Pea sized painful cyst of left groin. Not warm to touch. No surrounding erythema.  Neurological:     General: No focal deficit present.     Mental Status: He is alert and oriented to person, place, and time.  Psychiatric:        Mood and Affect: Mood normal.          Assessment & Plan:  Aaron AasAaron AasLeelen was seen today for groin pain.  Diagnoses and all orders for this visit:  Inflamed sebaceous cyst -  doxycycline  (VIBRA -TABS) 100 MG tablet; Take 1 tablet (100 mg total) by mouth 2 (two) times daily. -     Urine Culture  Left groin pain -     Urine Culture  STD exposure -     GC/Chlamydia Probe Amp([facility]) -     Urine Culture  Other orders -     Discontinue: doxycycline  (VIBRA -TABS) 100 MG tablet; Take 1 tablet (100 mg total) by mouth 2 (two) times daily.   Cyst in abdominal area seems to be inflamed sebaceous cyst Consider warm compresses Finish course of doxycycline  STD testing ordered Urine culture ordered Follow up if symptoms persist or worsen or change.    Deema Juncaj, PA-C

## 2023-06-30 ENCOUNTER — Encounter: Payer: Self-pay | Admitting: Physician Assistant

## 2023-06-30 LAB — URINE CULTURE: Organism ID, Bacteria: NO GROWTH

## 2023-06-30 LAB — GC/CHLAMYDIA PROBE AMP
Chlamydia trachomatis, NAA: NEGATIVE
Neisseria Gonorrhoeae by PCR: NEGATIVE

## 2023-06-30 NOTE — Progress Notes (Signed)
 Negative for STD and no bacteria found in urine.
